# Patient Record
Sex: Female | Born: 2000 | Race: Black or African American | Hispanic: No | Marital: Single | Smoking: Never smoker
Health system: Southern US, Community
[De-identification: ages and names within clinical notes are randomized; demographics above are authoritative.]

## PROBLEM LIST (undated history)

## (undated) DIAGNOSIS — J45909 Unspecified asthma, uncomplicated: Secondary | ICD-10-CM

---
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
## 2019-05-20 ENCOUNTER — Ambulatory Visit: Payer: 59 | Attending: Internal Medicine

## 2019-05-20 DIAGNOSIS — Z23 Encounter for immunization: Secondary | ICD-10-CM

## 2019-05-20 NOTE — Progress Notes (Signed)
   Covid-19 Vaccination Clinic  Name:  BENINA LILLARD    MRN: [id number] DOB: August 07, 2000  05/20/2019  Ms. Seacat was observed post Covid-19 immunization for 15 minutes without incident. She was provided with Vaccine Information Sheet and instruction to access the V-Safe system.   Ms. Grelle was instructed to call 911 with any severe reactions post vaccine: Marland Kitchen Difficulty breathing  . Swelling of face and throat  . A fast heartbeat  . A bad rash all over body  . Dizziness and weakness   Immunizations Administered    Name Date Dose VIS Date Route   Pfizer COVID-19 Vaccine 05/20/2019  3:43 PM 0.3 mL 02/11/2019 Intramuscular   Manufacturer: [place]   Lot: [id number]   [place]: [id number]

## 2019-06-10 ENCOUNTER — Ambulatory Visit: Payer: 59 | Attending: Internal Medicine

## 2019-06-10 DIAGNOSIS — Z23 Encounter for immunization: Secondary | ICD-10-CM

## 2019-06-10 NOTE — Progress Notes (Signed)
   Covid-19 Vaccination Clinic  Name:  Cheyenne Jordan    MRN: [id number] DOB: 2001/01/19  06/10/2019  [name] was observed post Covid-19 immunization for 15 minutes without incident. She was provided with Vaccine Information Sheet and instruction to access the V-Safe system.   [name] was instructed to call 911 with any severe reactions post vaccine: Marland Kitchen Difficulty breathing  . Swelling of face and throat  . A fast heartbeat  . A bad rash all over body  . Dizziness and weakness   Immunizations Administered    Name Date Dose VIS Date Route   Pfizer COVID-19 Vaccine 06/10/2019  3:17 PM 0.3 mL 02/11/2019 Intramuscular   Manufacturer: [place]   Lot: [id number]   [place]: [id number]

## 2022-02-09 ENCOUNTER — Ambulatory Visit
Admission: EM | Admit: 2022-02-09 | Discharge: 2022-02-09 | Disposition: A | Discharge status: Home / Self Care | Payer: 59 | Attending: Physician Assistant | Admitting: Physician Assistant

## 2022-02-09 ENCOUNTER — Encounter: Payer: Self-pay | Admitting: Emergency Medicine

## 2022-02-09 DIAGNOSIS — R0789 Other chest pain: Secondary | ICD-10-CM

## 2022-02-09 DIAGNOSIS — R0602 Shortness of breath: Secondary | ICD-10-CM

## 2022-02-09 DIAGNOSIS — J45901 Unspecified asthma with (acute) exacerbation: Secondary | ICD-10-CM

## 2022-02-09 MED ORDER — ALBUTEROL SULFATE (2.5 MG/3ML) 0.083% IN NEBU
2.5000 mg | INHALATION_SOLUTION | Freq: Once | RESPIRATORY_TRACT | Status: AC
  Administered 2022-02-09: 2.5 mg via RESPIRATORY_TRACT

## 2022-02-09 MED ORDER — ALBUTEROL SULFATE HFA 108 (90 BASE) MCG/ACT IN AERS: 1.0000 | INHALATION_SPRAY | Freq: Four times a day (QID) | RESPIRATORY_TRACT | 0 refills | Status: AC | PRN

## 2022-02-09 NOTE — ED Triage Notes (Signed)
Patient c/o chest tightness that started a hour ago.  Patient does not have any inhalers at home.  Patient states that she has had asthma years ago.  Patient denies any cold symptoms.

## 2022-02-09 NOTE — Discharge Instructions (Addendum)
-  You were given a breathing treatment in clinic today for asthma exacerbation and I have refilled your inhaler.

## 2022-02-09 NOTE — ED Provider Notes (Signed)
[facility]    CSN: [id number] Arrival date & time: 02/09/22  1558      History   Chief Complaint Chief Complaint  Patient presents with   Cough   Shortness of Breath    HPI Cheyenne Jordan is a 21 y.o. female presenting for chest tightness and shortness of breath that started about an hour ago.  She states that she was at work and started to feel like her chest was tight and she could not breathe.  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  She does not have an inhaler presently.  She says she was wearing perfume but usually that does not cause her to have an asthma attack.  She cannot think of any potential triggers and she denies any recent illnesses.  She has not any fever, cough, congestion, sore throat.  No other complaints.  HPI  Past Medical History:  Diagnosis Date   Asthma     Patient Active Problem List   Diagnosis Date Noted   PHARYNGITIS 11/03/2009   ACUTE BRONCHOSPASM 11/03/2009   FEVER UNSPECIFIED 11/03/2009    History reviewed. No pertinent surgical history.  OB History   No obstetric history on file.      Home Medications    Prior to Admission medications   Medication Sig Start Date End Date Taking? Authorizing Provider  albuterol (VENTOLIN HFA) 108 (90 Base) MCG/ACT inhaler Inhale 1-2 puffs into the lungs every 6 (six) hours as needed for wheezing or shortness of breath. 02/09/22  Yes Danton Clap PA-C    Family History History reviewed. No pertinent family history.  Social History Social History   Tobacco Use   Smoking status: Never   Smokeless tobacco: Never  Vaping Use   Vaping Use: Never used  Substance Use Topics   Alcohol use: No   Drug use: No     Allergies   Patient has no known allergies.   Review of Systems Review of Systems  Constitutional:  Negative for chills, diaphoresis, fatigue and fever.  HENT:  Negative for congestion, ear pain,  rhinorrhea, sinus pressure, sinus pain and sore throat.   Respiratory:  Positive for chest tightness and shortness of breath. Negative for cough.   Cardiovascular:  Negative for chest pain.  Gastrointestinal:  Negative for abdominal pain, nausea and vomiting.  Musculoskeletal:  Negative for arthralgias and myalgias.  Skin:  Negative for rash.  Neurological:  Negative for weakness and headaches.  Hematological:  Negative for adenopathy.     Physical Exam Triage Vital Signs ED Triage Vitals  Enc Vitals Group     BP 02/09/22 1607 100/70     Pulse Rate 02/09/22 1607 89     Resp 02/09/22 1607 15     Temp 02/09/22 1607 97.8 F (36.6 C)     Temp Source 02/09/22 1607 Oral     SpO2 02/09/22 1607 97 %     Weight 02/09/22 1606 235 lb 0.2 oz (106.6 kg)     Height 02/09/22 1606 '5\' 4"'$  (1.626 m)     Head Circumference --      Peak Flow --      Pain Score 02/09/22 1606 6     Pain Loc --      Pain Edu? --      Excl. in [place]? --    No data found.  Updated Vital Signs BP 100/70 (BP Location: Left Arm)  Pulse 89   Temp 97.8 F (36.6 C) (Oral)   Resp 15   Ht '5\' 4"'$  (1.626 m)   Wt 235 lb 0.2 oz (106.6 kg)   LMP 01/19/2022 (Approximate)   SpO2 97%   BMI 40.34 kg/m     Physical Exam Vitals and nursing note reviewed.  Constitutional:      General: She is not in acute distress.    Appearance: Normal appearance. She is not ill-appearing or toxic-appearing.  HENT:     Head: Normocephalic and atraumatic.     Nose: Nose normal.     Mouth/Throat:     Mouth: Mucous membranes are moist.     Pharynx: Oropharynx is clear.  Eyes:     General: No scleral icterus.       Right eye: No discharge.        Left eye: No discharge.     Conjunctiva/sclera: Conjunctivae normal.  Cardiovascular:     Rate and Rhythm: Normal rate and regular rhythm.     Heart sounds: Normal heart sounds.  Pulmonary:     Effort: Pulmonary effort is normal. No respiratory distress.     Breath sounds: Normal breath  sounds.  Musculoskeletal:     Cervical back: Neck supple.  Skin:    General: Skin is dry.  Neurological:     General: No focal deficit present.     Mental Status: She is alert. Mental status is at baseline.     Motor: No weakness.     Gait: Gait normal.  Psychiatric:        Mood and Affect: Mood normal.        Behavior: Behavior normal.        Thought Content: Thought content normal.      UC Treatments / Results  Labs (all labs ordered are listed, but only abnormal results are displayed) Labs Reviewed - No data to display  EKG   Radiology No results found.  Procedures Procedures (including critical care time)  Medications Ordered in UC Medications  albuterol (PROVENTIL) (2.5 MG/3ML) 0.083% nebulizer solution 2.5 mg (2.5 mg Nebulization Given 02/09/22 1621)    Initial Impression / Assessment and Plan / UC Course  I have reviewed the triage vital signs and the nursing notes.  Pertinent labs & imaging results that were available during my care of the patient were reviewed by me and considered in my medical decision making (see chart for details).    21 year old female with history of allergy induced asthma presents for chest tightness and shortness of breath that started about an hour ago.  Does not have an albuterol inhaler and requested breathing treatment today.  Vitals are normal and stable and she is overall well-appearing.  In no acute distress but she is speaking in short sentences.  Oxygen is 97%.  On exam I do not hear any wheezing or abnormal breath sounds but breath sounds are bit diminished.  Ordered albuterol nebulizer.  Patient reporting improvement with albuterol nebulizer.  She reports that she still feels a little bit of chest tightness but is breathing better.  Auscultation of her lungs reveals that she is moving air better than she was initially.  We discussed corticosteroid use but she declines at this time and would just like to have her albuterol  inhaler refilled.  Refilled albuterol inhaler.  Advised plenty rest and fluids.  Advised going to emergency department if recurrent exacerbation.  Advised considering reevaluation if she starts to become ill.  Otherwise, follow-up  with PCP.   Final Clinical Impressions(s) / UC Diagnoses   Final diagnoses:  Asthma with acute exacerbation, unspecified asthma severity, unspecified whether persistent  Chest tightness  Shortness of breath     Discharge Instructions      -You were given a breathing treatment in clinic today for asthma exacerbation and I have refilled your inhaler.     ED Prescriptions     Medication Sig Dispense Auth. Provider   albuterol (VENTOLIN HFA) 108 (90 Base) MCG/ACT inhaler Inhale 1-2 puffs into the lungs every 6 (six) hours as needed for wheezing or shortness of breath. 1 g Danton Clap, PA-C      PDMP not reviewed this encounter.   Danton Clap, PA-C 02/09/22 1637

## 2022-08-03 ENCOUNTER — Ambulatory Visit
Admission: EM | Admit: 2022-08-03 | Discharge: 2022-08-03 | Disposition: A | Discharge status: Home / Self Care | Payer: Commercial Managed Care - PPO | Attending: Emergency Medicine | Admitting: Emergency Medicine

## 2022-08-03 DIAGNOSIS — H60393 Other infective otitis externa, bilateral: Secondary | ICD-10-CM

## 2022-08-03 DIAGNOSIS — H6123 Impacted cerumen, bilateral: Secondary | ICD-10-CM | POA: Diagnosis not present

## 2022-08-03 MED ORDER — NEOMYCIN-POLYMYXIN-HC 3.5-10000-1 OT SUSP: 4.0000 [drp] | Freq: Three times a day (TID) | OTIC | 0 refills | Status: AC

## 2022-08-03 NOTE — Discharge Instructions (Addendum)
Instill 4 drops of Cortisporin otic in both ears 3 times a day for 5 days for treatment of your external ear infections..  Take an over-the-counter probiotic 1 hour after each dose of antibiotic to prevent diarrhea.  Use over-the-counter Tylenol and ibuprofen as needed for pain or fever.  Place a hot water bottle, or heating pad, underneath your pillowcase at night to help dilate up your ear and aid in pain relief as well as resolution of the infection.  Return for reevaluation for any new or worsening symptoms.

## 2022-08-03 NOTE — ED Provider Notes (Addendum)
[facility]    CSN: [id number] Arrival date & time: 08/03/22  1157      History   Chief Complaint No chief complaint on file.   HPI Cheyenne Jordan is a 22 y.o. female.   HPI  22 year old female with a past medical history significant for asthma presents for evaluation of pain in both of her ears that started yesterday.  She also reports that she has had marked decrease in her hearing she did have ringing yesterday but not today.  She denies runny nose, nasal congestion, or fever.  She also denies any drainage from her ears.  Past Medical History:  Diagnosis Date   Asthma     Patient Active Problem List   Diagnosis Date Noted   PHARYNGITIS 11/03/2009   ACUTE BRONCHOSPASM 11/03/2009   FEVER UNSPECIFIED 11/03/2009    History reviewed. No pertinent surgical history.  OB History   No obstetric history on file.      Home Medications    Prior to Admission medications   Medication Sig Start Date End Date Taking? Authorizing Provider  neomycin-polymyxin-hydrocortisone (CORTISPORIN) 3.5-10000-1 OTIC suspension Place 4 drops into both ears 3 (three) times daily. 08/03/22  Yes Becky Augusta, NP  albuterol (VENTOLIN HFA) 108 (90 Base) MCG/ACT inhaler Inhale 1-2 puffs into the lungs every 6 (six) hours as needed for wheezing or shortness of breath. 02/09/22   Shirlee Latch, PA-C    Family History No family history on file.  Social History Social History   Tobacco Use   Smoking status: Never   Smokeless tobacco: Never  Vaping Use   Vaping Use: Never used  Substance Use Topics   Alcohol use: Yes    Comment: occasionally   Drug use: No     Allergies   Patient has no known allergies.   Review of Systems Review of Systems  Constitutional:  Negative for fever.  HENT:  Positive for ear pain, hearing loss and tinnitus. Negative for congestion and rhinorrhea.      Physical Exam Triage Vital Signs ED Triage Vitals  Enc Vitals Group     BP 08/03/22  1230 105/82     Pulse Rate 08/03/22 1230 88     Resp 08/03/22 1230 20     Temp 08/03/22 1230 98.4 F (36.9 C)     Temp Source 08/03/22 1230 Oral     SpO2 08/03/22 1230 98 %     Weight 08/03/22 1232 220 lb (99.8 kg)     Height 08/03/22 1232 5\' 5"  (1.651 m)     Head Circumference --      Peak Flow --      Pain Score 08/03/22 1230 6     Pain Loc --      Pain Edu? --      Excl. in GC? --    No data found.  Updated Vital Signs BP 105/82 (BP Location: Left Arm)   Pulse 88   Temp 98.4 F (36.9 C) (Oral)   Resp 20   Ht 5\' 5"  (1.651 m)   Wt 220 lb (99.8 kg)   LMP 07/28/2022   SpO2 98%   BMI 36.61 kg/m   Visual Acuity Right Eye Distance:   Left Eye Distance:   Bilateral Distance:    Right Eye Near:   Left Eye Near:    Bilateral Near:     Physical Exam Vitals and nursing note reviewed.  Constitutional:      Appearance: Normal appearance. She is  not ill-appearing.  HENT:     Head: Normocephalic and atraumatic.     Right Ear: External ear normal. There is impacted cerumen.     Left Ear: External ear normal. There is impacted cerumen.     Ears:     Comments: Patient has yellow wax impacted in both external auditory canals.  She does have tenderness with attempted instillation of speculum in the left ear. Skin:    General: Skin is warm and dry.     Capillary Refill: Capillary refill takes less than 2 seconds.  Neurological:     General: No focal deficit present.     Mental Status: She is alert and oriented to person, place, and time.      UC Treatments / Results  Labs (all labs ordered are listed, but only abnormal results are displayed) Labs Reviewed - No data to display  EKG   Radiology No results found.  Procedures Procedures (including critical care time)  Medications Ordered in UC Medications - No data to display  Initial Impression / Assessment and Plan / UC Course  I have reviewed the triage vital signs and the nursing notes.  Pertinent labs &  imaging results that were available during my care of the patient were reviewed by me and considered in my medical decision making (see chart for details).   Patient is a pleasant, nontoxic-appearing 22 year old female presenting for evaluation of bilateral ear pain with muffled hearing that started yesterday.  On exam she does have impacted cerumen in both external auditory canals.  I will order ear lavage and reevaluate for possibility of otitis media or externa.  Following bilateral ear lavage I am able to visualize the tympanic membrane's bilaterally which are pearly gray.  Both external auditory canals are markedly edematous and there is some white discharge on the floor both canals suggesting otitis externa.  Patient denies any recent swimming.  I will treat her with Cortisporin otic 4 drops 3 times a day in both ears for 5 days.  She can also use over-the-counter Tylenol and or ibuprofen as needed for pain.   Final Clinical Impressions(s) / UC Diagnoses   Final diagnoses:  Infective otitis externa of both ears  Bilateral impacted cerumen     Discharge Instructions      Instill 4 drops of Cortisporin otic in both ears 3 times a day for 5 days for treatment of your external ear infections..  Take an over-the-counter probiotic 1 hour after each dose of antibiotic to prevent diarrhea.  Use over-the-counter Tylenol and ibuprofen as needed for pain or fever.  Place a hot water bottle, or heating pad, underneath your pillowcase at night to help dilate up your ear and aid in pain relief as well as resolution of the infection.  Return for reevaluation for any new or worsening symptoms.      ED Prescriptions     Medication Sig Dispense Auth. Provider   neomycin-polymyxin-hydrocortisone (CORTISPORIN) 3.5-10000-1 OTIC suspension Place 4 drops into both ears 3 (three) times daily. 10 mL Becky Augusta, NP      PDMP not reviewed this encounter.   Becky Augusta, NP 08/03/22 1335     Becky Augusta, NP 08/03/22 1336

## 2022-08-03 NOTE — ED Triage Notes (Signed)
Patient presents with bilateral ear pain that she states started yesterday. She took at [facility].
